# Patient Record
Sex: Male | Born: 1995 | Race: Black or African American | Hispanic: No | Marital: Single | Smoking: Never smoker
Health system: Southern US, Community
[De-identification: ages and names within clinical notes are randomized; demographics above are authoritative.]

## PROBLEM LIST (undated history)

## (undated) DIAGNOSIS — R569 Unspecified convulsions: Secondary | ICD-10-CM

## (undated) HISTORY — DX: Unspecified convulsions: R56.9

---

## 2017-06-13 ENCOUNTER — Other Ambulatory Visit: Payer: Self-pay

## 2017-06-13 ENCOUNTER — Encounter: Payer: Self-pay | Admitting: Family Medicine

## 2017-06-13 ENCOUNTER — Ambulatory Visit (INDEPENDENT_AMBULATORY_CARE_PROVIDER_SITE_OTHER): Payer: BC Managed Care – PPO

## 2017-06-13 ENCOUNTER — Ambulatory Visit: Payer: BC Managed Care – PPO | Admitting: Family Medicine

## 2017-06-13 VITALS — BP 106/60 | HR 49 | Temp 98.4°F | Resp 18 | Ht 76.0 in | Wt 202.8 lb

## 2017-06-13 DIAGNOSIS — S4992XA Unspecified injury of left shoulder and upper arm, initial encounter: Secondary | ICD-10-CM

## 2017-06-13 DIAGNOSIS — S6991XA Unspecified injury of right wrist, hand and finger(s), initial encounter: Secondary | ICD-10-CM

## 2017-06-13 DIAGNOSIS — S62111A Displaced fracture of triquetrum [cuneiform] bone, right wrist, initial encounter for closed fracture: Secondary | ICD-10-CM | POA: Diagnosis not present

## 2017-06-13 DIAGNOSIS — S43422A Sprain of left rotator cuff capsule, initial encounter: Secondary | ICD-10-CM

## 2017-06-13 MED ORDER — IBUPROFEN 800 MG PO TABS: 800.0000 mg | ORAL_TABLET | Freq: Three times a day (TID) | ORAL | 0 refills | Status: AC | PRN

## 2017-06-13 NOTE — Patient Instructions (Addendum)
IF you received an x-ray today, you will receive an invoice from [facility]. Please contact [facility] at [phone] with questions or concerns regarding your invoice.   IF you received labwork today, you will receive an invoice from [place]. Please contact [facility] at [phone] with questions or concerns regarding your invoice.   Our billing staff will not be able to assist you with questions regarding bills from these companies.  You will be contacted with the lab results as soon as they are available. The fastest way to get your results is to activate your My Chart account. Instructions are located on the last page of this paperwork. If you have not heard from [place] regarding the results in 2 weeks, please contact this office.     Wrist Splint A splint is a medical device that keeps an injured part of your body from moving. A splint supports your wrist like a cast, but it is more flexible. It can be removed or loosened. The supporting part of a splint does not completely surround your wrist. It is held in place with an elastic band or straps. You may need a wrist splint if you have hurt your wrist or if you have a condition that causes swelling. Depending on the type of wrist problem you have, your splint may extend up your arm, onto your hand, or around your thumb. The wrist splint may be worn to:  Support your wrist.  Protect your injury.  Prevent further injury.  Prevent movement.  Reduce pain.  Promote healing. RISKS AND COMPLICATIONS The most dangerous complication of wearing a splint is having a reduced blood supply to your wrist or hand. This can happen if there is a lot of swelling or if the splint is too tight. This results in a condition called compartment syndrome and can cause permanent damage. Symptoms include:  Pain that is getting worse.  Tingling and numbness.  Changes in skin color (paleness or a bluish color).  Cold fingers. Other  complications of wearing a splint can include:   Skin irritation that can cause: ? Itching. ? Rash. ? Skin sores. ? Skin infection.  Wrist stiffness. This can occur if you have worn a splint for a long time. HOW TO USE YOUR WRIST SPLINT Your wrist splint should be tight enough to support your wrist without cutting off your blood supply. How long you need to wear the splint depends on the type of wrist problem you have. Your health care provider will instruct you on how to wear your wrist splint and for how long.  Follow all your health care provider's instructions.  Take medicine only as directed by your health care provider.  Keep your wrist elevated above the level of your heart while resting.  Ice may help reduce pain and swelling. ? Place ice in a plastic bag. ? Place a towel between your splint and the bag. ? Leave the ice on for 20 minutes, 2-3 times a day.  Do not get your splint wet.  Do not push objects under your splint to scratch your skin.  Loosen your splint if it feels too tight. Consult your health care provider if you have questions about how tight to wear the splint.  Keep all follow-up visits as directed by your health care provider. This is important. SEEK MEDICAL CARE IF:  You have wrist pain or swelling that does not go away.  The skin around or under your splint becomes red, itchy, or  moist.  You have chills or fever.  Your splint feels too tight or too loose.  Your splint gets damaged. SEEK IMMEDIATE MEDICAL CARE IF: You have symptoms of compartment syndrome. These include:   Pain that is getting worse.  Tingling and numbness.  Changes in skin color (paleness or a bluish color).  Cold fingers. MAKE SURE YOU:  Understand these instructions.  Will watch your condition.  Will get help right away if you are not doing well or get worse. This information is not intended to replace advice given to you by your health care provider. Make sure  you discuss any questions you have with your health care provider. Document Released: 02/22/2006 Document Revised: 04/02/2014 Document Reviewed: 06/23/2013 Elsevier Interactive Patient Education  2017 [employer] Inc.  Wrist Fracture Treated With Immobilization A wrist fracture is a break or crack in one of the bones of your wrist. Your wrist is made up of eight small bones at the palm of your hand (carpal bones) and two long bones that make up your forearm (radius and ulna). If the joint is stable and the bones are still in their normal position (nondisplaced), the injury may be treated with immobilization. This involves the use of a cast, splint, or sling to hold your arm in place. Immobilization ensures that your bones continue to stay in the correct position while your arm is healing. What are the causes? This condition may be caused by:  A direct force to the wrist.  Falling on an outstretched hand.  Trauma, such as a car accident or a fall.  What increases the risk? This condition is more likely to develop in people who:  Do contact and high-risk sports, such as skiing, biking, and ice skating.  Take steroid medicines.  Smoke.  Are male.  Are Caucasian.  Drink more than three alcoholic beverages per day.  Have low or lowered bone density (osteoporosis or osteopenia).  Are older.  Have a history of previous fractures.  What are the signs or symptoms? Symptoms of this condition include:  Pain.  Swelling.  Bruising.  Not being able to move the wrist normally.  Additionally, the wrist may hang in an odd position or appear deformed. How is this diagnosed? This condition may be diagnosed based on a physical exam and X-rays. You may also have a CT scan or MRI. How is this treated? Treatment for this condition involves wearing a cast or splint until the injured area is stable enough for you to begin range-of-motion exercises. You also may be given a sling. You may  also be prescribed pain medicine. Follow these instructions at home: If you have a splint:  Wear the splint as told by your health care provider. Remove it only as told by your health care provider.  Loosen the splint if your fingers tingle, become numb, or turn cold and blue.  Do not let your splint get wet if it is not waterproof.  Keep the splint clean. If you have a sling:  Wear it as told by your health care provider. Remove it only as told by your health care provider. If you have a cast:  Do not stick anything inside the cast to scratch your skin. Doing that increases your risk of infection.  Check the skin around the cast every day. Report any concerns to your health care provider. You may put lotion on dry skin around the edges of the cast. Do not apply lotion to the skin underneath the cast.  Do not let your cast get wet if it is not waterproof.  Keep the cast clean. Bathing  Do not take baths, swim, or use a hot tub until your health care provider approves. Ask your health care provider if you can take showers. You may only be allowed to take sponge baths for bathing.  If your cast or splint is not waterproof, cover it with a watertight plastic bag when you take a bath or a shower.  If you have a sling, remove it for bathing only if your health care provider tells you that it is safe to do that. Managing pain, stiffness, and swelling  If directed, apply ice to the injured area. ? Put ice in a plastic bag. ? Place a towel between your skin and the bag. ? Leave the ice on for 20 minutes, 2-3 times per day.  Move your fingers often to avoid stiffness and to lessen swelling.  Raise (elevate) the injured area above the level of your heart while you are sitting or lying down. Driving  Do not drive or operate heavy machinery while taking prescription pain medicine.  Ask your health care provider when it is safe to drive if you have a cast, splint, or sling on your  wrist. Activity  Return to your normal activities as told by your health care provider. Ask your health care provider what activities are safe for you.  Do range-of-motion exercises only as told by your health care provider or physical therapist. General instructions  Do not put pressure on any part of the cast or splint until it is fully hardened. This may take several hours.  Do not use any tobacco products, such as cigarettes, chewing tobacco, and e-cigarettes. Tobacco can delay bone healing. If you need help quitting, ask your health care provider.  Take over-the-counter and prescription medicines only as told by your health care provider.  Keep all follow-up visits as told by your health care provider. This is important. Contact a health care provider if:  Your cast, splint, or sling is damaged or loose.  You have any new pain, swelling, or bruising.  Your pain, swelling, and bruising do not improve.  You have a fever.  You have chills. Get help right away if:  Your skin or fingers on your injured arm turn blue or gray.  Your arm feels cold or gets numb.  You have severe pain in your injured wrist. This information is not intended to replace advice given to you by your health care provider. Make sure you discuss any questions you have with your health care provider. Document Released: 12/20/2004 Document Revised: 08/24/2015 Document Reviewed: 11/24/2014 Elsevier Interactive Patient Education  2018 Elsevier Inc.  Rotator Cuff Injury Rotator cuff injury is any type of injury to the set of muscles and tendons that make up the stabilizing unit of your shoulder. This unit holds the ball of your upper arm bone (humerus) in the socket of your shoulder blade (scapula). What are the causes? Injuries to your rotator cuff most commonly come from sports or activities that cause your arm to be moved repeatedly over your head. Examples of this include throwing, weight lifting,  swimming, or racquet sports. Long lasting (chronic) irritation of your rotator cuff can cause soreness and swelling (inflammation), bursitis, and eventual damage to your tendons, such as a tear (rupture). What are the signs or symptoms? Acute rotator cuff tear:  Sudden tearing sensation followed by severe pain shooting from your upper shoulder down  your arm toward your elbow.  Decreased range of motion of your shoulder because of pain and muscle spasm.  Severe pain.  Inability to raise your arm out to the side because of pain and loss of muscle power (large tears).  Chronic rotator cuff tear:  Pain that usually is worse at night and may interfere with sleep.  Gradual weakness and decreased shoulder motion as the pain worsens.  Decreased range of motion.  Rotator cuff tendinitis:  Deep ache in your shoulder and the outside upper arm over your shoulder.  Pain that comes on gradually and becomes worse when lifting your arm to the side or turning it inward.  How is this diagnosed? Rotator cuff injury is diagnosed through a medical history, physical exam, and imaging exam. The medical history helps determine the type of rotator cuff injury. Your health care provider will look at your injured shoulder, feel the injured area, and ask you to move your shoulder in different positions. X-ray exams typically are done to rule out other causes of shoulder pain, such as fractures. MRI is the exam of choice for the most severe shoulder injuries because the images show muscles and tendons. How is this treated? Chronic tear:  Medicine for pain, such as acetaminophen or ibuprofen.  Physical therapy and range-of-motion exercises may be helpful in maintaining shoulder function and strength.  Steroid injections into your shoulder joint.  Surgical repair of the rotator cuff if the injury does not heal with noninvasive treatment.  Acute tear:  Anti-inflammatory medicines such as ibuprofen and  naproxen to help reduce pain and swelling.  A sling to help support your arm and rest your rotator cuff muscles. Long-term use of a sling is not advised. It may cause significant stiffening of the shoulder joint.  Surgery may be considered within a few weeks, especially in younger, active people, to return the shoulder to full function.  Indications for surgical treatment include the following: ? Age younger than 60 years. ? Rotator cuff tears that are complete. ? Physical therapy, rest, and anti-inflammatory medicines have been used for 6-8 weeks, with no improvement. ? Employment or sporting activity that requires constant shoulder use.  Tendinitis:  Anti-inflammatory medicines such as ibuprofen and naproxen to help reduce pain and swelling.  A sling to help support your arm and rest your rotator cuff muscles. Long-term use of a sling is not advised. It may cause significant stiffening of the shoulder joint.  Severe tendinitis may require: ? Steroid injections into your shoulder joint. ? Physical therapy. ? Surgery.  Follow these instructions at home:  Apply ice to your injury: ? Put ice in a plastic bag. ? Place a towel between your skin and the bag. ? Leave the ice on for 20 minutes, 2-3 times a day.  If you have a shoulder immobilizer (sling and straps), wear it until told otherwise by your health care provider.  You may want to sleep on several pillows or in a recliner at night to lessen swelling and pain.  Only take over-the-counter or prescription medicines for pain, discomfort, or fever as directed by your health care provider.  Do simple hand squeezing exercises with a soft rubber ball to decrease hand swelling. Contact a health care provider if:  Your shoulder pain increases, or new pain or numbness develops in your arm, hand, or fingers.  Your hand or fingers are colder than your other hand. Get help right away if:  Your arm, hand, or fingers are numb or  tingling.  Your arm, hand, or fingers are increasingly swollen and painful, or they turn white or blue. This information is not intended to replace advice given to you by your health care provider. Make sure you discuss any questions you have with your health care provider. Document Released: 03/09/2000 Document Revised: 08/18/2015 Document Reviewed: 10/22/2012 Elsevier Interactive Patient Education  2018 [employer]

## 2017-06-13 NOTE — Progress Notes (Signed)
Subjective:  By signing my name below, I, Casey Sanchez, attest that this documentation has been prepared under the direction and in the presence of Norberto Sorenson, MD Electronically Signed: Charline Bills, ED Scribe 06/13/2017 at 9:47 AM.   Patient ID: Casey Sanchez, male    DOB: 1996/02/27, 22 y.o.   MRN: [id number]  Chief Complaint  Patient presents with  . Wrist Injury    right wrist, pt states injury happened last night while playing basketball. Pt states wrist hurts with movement and states he has some swellling.  . Shoulder Injury    left shoulder, pt states it feels like shoulder is out of place.   HPI Casey Sanchez is a 22 y.o. male who presents to Primary Care at [facility] complaining of R wrist and L shoulder injury that occurred last night while playing basketball. Pt states that he was attempting to dunk last night and grab the rim but was going too fast. He landed on his L side and tried to brace himself with his R hand, injuring his L shoulder and R wrist. Pt reports R wrist swelling and pain that is exacerbated with movement. Also reports L shoulder pain that is exacerbated with lifting. He reports similar injury to the L shoulder in the past that he treated with rest and ice. Pt is R hand dominant.  Past Medical History:  Diagnosis Date  . Seizures (HCC)    Current Outpatient Medications on File Prior to Visit  Medication Sig Dispense Refill  . levETIRAcetam (KEPPRA) 500 MG tablet Take 500 mg by mouth 2 (two) times daily.     No current facility-administered medications on file prior to visit.    History reviewed. No pertinent surgical history.  No Known Allergies Family History  Problem Relation Age of Onset  . Cancer Mother   . Hyperlipidemia Father    Social History   Socioeconomic History  . Marital status: Single    Spouse name: Not on file  . Number of children: Not on file  . Years of education: Not on file  . Highest education level: Not on file  Occupational  History  . Not on file  Social Needs  . Financial resource strain: Not on file  . Food insecurity:    Worry: Not on file    Inability: Not on file  . Transportation needs:    Medical: Not on file    Non-medical: Not on file  Tobacco Use  . Smoking status: Never Smoker  . Smokeless tobacco: Never Used  Substance and Sexual Activity  . Alcohol use: Yes  . Drug use: Never  . Sexual activity: Not on file  Lifestyle  . Physical activity:    Days per week: Not on file    Minutes per session: Not on file  . Stress: Not on file  Relationships  . Social connections:    Talks on phone: Not on file    Gets together: Not on file    Attends religious service: Not on file    Active member of club or organization: Not on file    Attends meetings of clubs or organizations: Not on file    Relationship status: Not on file  Other Topics Concern  . Not on file  Social History Narrative  . Not on file   Depression screen [facility] 2/9 06/13/2017  Decreased Interest 0  Down, Depressed, Hopeless 0  PHQ - 2 Score 0     Review of Systems  Musculoskeletal: Positive  for arthralgias and joint swelling.      Objective:   Physical Exam  Constitutional: He is oriented to person, place, and time. He appears well-developed and well-nourished. No distress.  HENT:  Head: Normocephalic and atraumatic.  Eyes: Conjunctivae and EOM are normal.  Neck: Neck supple. No tracheal deviation present.  Cardiovascular: Normal rate.  Pulmonary/Chest: Effort normal. No respiratory distress.  Musculoskeletal: Normal range of motion.  R wrist: Tenderness over proximal 5th metatarsal. Edema and focal tenderness over dorsal aspect over lateral and mid metacarpal. Mild limitation in flexion, severe limitation in extension.  L Shoulder: pain with abduction greater than 90 degrees, flexion greater than 90 degrees. Mild limitation in extension. Mild limitation in adduction. Point tenderness over tip of acromion and  posterior glenohumeral joint aspect.  Neurological: He is alert and oriented to person, place, and time.  Skin: Skin is warm and dry.  Psychiatric: He has a normal mood and affect. His behavior is normal.  Nursing note and vitals reviewed.  Dg Wrist Complete Right  Result Date: 06/13/2017 CLINICAL DATA:  Fall playing basketball. EXAM: RIGHT WRIST - COMPLETE 3+ VIEW COMPARISON:  No recent prior. FINDINGS: Tiny bony density is noted along the dorsum of the wrist. This may represent a tiny fracture fragment such as a triquetral fracture fragment. No other focal abnormalities identified. Distal radius is intact. No soft tissue foreign body. IMPRESSION: Tiny bony densities noted along the dorsum of the wrist. This may represent a tiny fracture fragment such as a triquetral fracture fragment. No other abnormalities identified. Electronically Signed   By: Maisie Fus  Register   On: 06/13/2017 10:27   Dg Shoulder Left  Result Date: 06/13/2017 CLINICAL DATA:  Status post fall, shoulder pain. Tenderness over the acromion. EXAM: LEFT SHOULDER - 2+ VIEW COMPARISON:  None. FINDINGS: There is no evidence of fracture or dislocation. There is no evidence of arthropathy or other focal bone abnormality. Soft tissues are unremarkable. IMPRESSION: No acute osseous injury of the left shoulder. Electronically Signed   By: Elige Ko   On: 06/13/2017 10:28   BP 106/60 (BP Location: Right Arm, Patient Position: Sitting, Cuff Size: Normal)   Pulse (!) 49   Temp 98.4 F (36.9 C) (Oral)   Resp 18   Ht 6\' 4"  (1.93 m)   Wt 202 lb 12.8 oz (92 kg)   SpO2 98%   BMI 24.69 kg/m     Assessment & Plan:   1. Closed chip fracture of triquetrum of right wrist, initial encounter   2. Wrist injury, right, initial encounter   3. Shoulder injury, left, initial encounter   4. Sprain of left rotator cuff capsule, initial encounter     Orders Placed This Encounter  Procedures  . DG Wrist Complete Right    Standing Status:    Future    Number of Occurrences:   1    Standing Expiration Date:   06/13/2018    Order Specific Question:   Reason for Exam (SYMPTOM  OR DIAGNOSIS REQUIRED)    Answer:   s/p fall onto shoulder and FOOSH yest playing basketball, point tenderness over acromion and dorsal mid & lateral wrist    Order Specific Question:   Preferred imaging location?    Answer:   External  . DG Shoulder Left    Standing Status:   Future    Number of Occurrences:   1    Standing Expiration Date:   06/13/2018    Order Specific Question:   Reason  for Exam (SYMPTOM  OR DIAGNOSIS REQUIRED)    Answer:   s/p fall onto shoulder and FOOSH yest playing basketball, point tenderness over acromion and dorsal mid & lateral wrist    Order Specific Question:   Preferred imaging location?    Answer:   External  . Ambulatory referral to Orthopedic Surgery    Referral Priority:   Medium    Referral Type:   Surgical    Referral Reason:   Specialty Services Required    Requested Specialty:   Orthopedic Surgery    Number of Visits Requested:   1    Meds ordered this encounter  Medications  . ibuprofen (ADVIL,MOTRIN) 800 MG tablet    Sig: Take 1 tablet (800 mg total) by mouth every 8 (eight) hours as needed.    Dispense:  30 tablet    Refill:  0    I personally performed the services described in this documentation, which was scribed in my presence. The recorded information has been reviewed and considered, and addended by me as needed.   Norberto SorensonEva Tamica Covell, M.D.  [facility]  [place] [address] [place], [place] [address] [phone] phone [phone] fax  12/19/17 3:14 AM

## 2017-06-18 ENCOUNTER — Encounter (INDEPENDENT_AMBULATORY_CARE_PROVIDER_SITE_OTHER): Payer: Self-pay | Admitting: Orthopaedic Surgery

## 2017-06-18 ENCOUNTER — Ambulatory Visit (INDEPENDENT_AMBULATORY_CARE_PROVIDER_SITE_OTHER): Payer: BC Managed Care – PPO | Admitting: Orthopaedic Surgery

## 2017-06-18 VITALS — BP 119/67 | HR 65 | Resp 16 | Ht 76.0 in | Wt 202.0 lb

## 2017-06-18 DIAGNOSIS — M79642 Pain in left hand: Secondary | ICD-10-CM | POA: Diagnosis not present

## 2017-06-18 NOTE — Progress Notes (Signed)
Office Visit Note   Patient: Casey Sanchez           Date of Birth: 02/05/1996           MRN: [id number] Visit Date: 06/18/2017              Requested by: Sherren Mocha, MD [address] [place], [place] [address] PCP: Patient, No Pcp Per   Assessment & Plan: Visit Diagnoses:  1. Pain of left hand     Plan: 6 days status post injury to left hand playing basketball.  Films at [facility] urgent care demonstrated a small avulsion fracture from the carpus.  Placed in a splint and asked to follow-up here.  I reviewed the x-rays and agree with the small avulsion chip.  He is minimally symptomatic.  Splint was removed.  He will try a volar wrist splint that he has at home for several weeks and avoid activities causing pain.  Should resolve by then.  If not he will return  Follow-Up Instructions: Return if symptoms worsen or fail to improve.   Orders:  No orders of the defined types were placed in this encounter.  No orders of the defined types were placed in this encounter.     Procedures: No procedures performed   Clinical Data: No additional findings.   Subjective: Chief Complaint  Patient presents with  . Right Wrist - Pain, Injury  . New Patient (Initial Visit)    r wrist fx from 06-13-17 no numbness, tinging or weakness just swelling.  Playing basketball 6 days ago when he "do not do the ball".  He got caught up on the rim and came down trying to break his fall with both of his arms behind his back.  He had onset of pain in the dorsum of his left nondominant hand.  [facility] with [facility]-rays demonstrate small avulsion "chip".  He was placed in a splint and Ace bandage and asked to follow-up in the office.  Feeling much better.  No numbness or tingling. The films on the PACS system and agree with a very small avulsion chip of occult to tell the origin but it is on the dorsum of the hand  HPI  Review of Systems  Constitutional: Negative for fatigue and  fever.  HENT: Negative for ear pain.   Eyes: Negative for pain.  Respiratory: Negative for cough and shortness of breath.   Cardiovascular: Negative for leg swelling.  Gastrointestinal: Negative for blood in stool, constipation and diarrhea.  Genitourinary: Negative for difficulty urinating.  Musculoskeletal: Negative for back pain and neck pain.  Skin: Negative for rash and wound.  Allergic/Immunologic: Negative for food allergies.  Neurological: Negative for dizziness, weakness, light-headedness, numbness and headaches.  Hematological: Does not bruise/bleed easily.  Psychiatric/Behavioral: Negative for sleep disturbance.     Objective: Vital Signs: BP 119/67 (BP Location: Left Arm, Patient Position: Sitting, Cuff Size: Normal)   Pulse 65   Resp 16   Ht 6\' 4"  (1.93 m)   Wt 202 lb (91.6 kg)   BMI 24.59 kg/m   Physical Exam  Ortho Exam splint removed from left hand.  Minimal tenderness in the dorsum of the hand with minimal swelling.  No ecchymosis or erythema.  Can make a full fist and released.  Neurovascular exam intact.  No wrist pain.  At the ulna or radius  Specialty Comments:  No specialty comments available.  Imaging: No results found.   PMFS History:  There are no active problems to display for this patient.  Past Medical History:  Diagnosis Date  . Seizures (HCC)     Family History  Problem Relation Age of Onset  . Cancer Mother   . Hyperlipidemia Father     History reviewed. No pertinent surgical history. Social History   Occupational History  . Not on file  Tobacco Use  . Smoking status: Never Smoker  . Smokeless tobacco: Never Used  Substance and Sexual Activity  . Alcohol use: Yes  . Drug use: Never  . Sexual activity: Not on file

## 2017-09-01 ENCOUNTER — Encounter (HOSPITAL_COMMUNITY): Payer: Self-pay | Admitting: Emergency Medicine

## 2017-09-01 ENCOUNTER — Emergency Department (HOSPITAL_COMMUNITY): Payer: BC Managed Care – PPO

## 2017-09-01 ENCOUNTER — Emergency Department (HOSPITAL_COMMUNITY)
Admission: EM | Admit: 2017-09-01 | Discharge: 2017-09-01 | Disposition: A | Discharge status: Home / Self Care | Payer: BC Managed Care – PPO | Attending: Emergency Medicine | Admitting: Emergency Medicine

## 2017-09-01 DIAGNOSIS — Z79899 Other long term (current) drug therapy: Secondary | ICD-10-CM | POA: Diagnosis not present

## 2017-09-01 DIAGNOSIS — G40909 Epilepsy, unspecified, not intractable, without status epilepticus: Secondary | ICD-10-CM | POA: Insufficient documentation

## 2017-09-01 DIAGNOSIS — R569 Unspecified convulsions: Secondary | ICD-10-CM

## 2017-09-01 LAB — BASIC METABOLIC PANEL
Anion gap: 8 (ref 5–15)
BUN: 11 mg/dL (ref 6–20)
CALCIUM: 9.4 mg/dL (ref 8.9–10.3)
CHLORIDE: 104 mmol/L (ref 101–111)
CO2: 28 mmol/L (ref 22–32)
CREATININE: 1.16 mg/dL (ref 0.61–1.24)
GFR calc Af Amer: >60 mL/min (ref >60)
GFR calc non Af Amer: >60 mL/min (ref >60)
Glucose, Bld: 114 mg/dL — ABNORMAL HIGH (ref 65–99)
Potassium: 3.7 mmol/L (ref 3.5–5.1)
Sodium: 140 mmol/L (ref 135–145)

## 2017-09-01 LAB — ETHANOL: Alcohol, Ethyl (B): <10 mg/dL (ref <10)

## 2017-09-01 LAB — CBG MONITORING, ED: Glucose-Capillary: 84 mg/dL (ref 65–99)

## 2017-09-01 MED ORDER — LEVETIRACETAM IN NACL 1000 MG/100ML IV SOLN
1000.0000 mg | Freq: Once | INTRAVENOUS | Status: AC
  Administered 2017-09-01: 1000 mg via INTRAVENOUS
  Filled 2017-09-01: qty 100

## 2017-09-01 NOTE — Discharge Instructions (Addendum)
You were seen today for seizure-like activity.  You need to make sure to take your Keppra as prescribed.  Follow-up with your primary physician or neurologist for adjustments of medication.

## 2017-09-01 NOTE — ED Triage Notes (Signed)
Pt arrives via EMS after a seizure witnessed by his date, reports full body involvement, he did fall off the bed. Abrasion to R face. Pt confused at this time. Did bite hi tongue. Hx of seizures- supposed to take keppra. ETOH on board, pt not answering other questions.

## 2017-09-01 NOTE — ED Notes (Signed)
Ambulated patient in hall with no difficulty this time. Alert and oriented. Provided discharge instructions and what I know of his whereabouts prior to seizure.

## 2017-09-01 NOTE — ED Provider Notes (Signed)
[facility] EMERGENCY DEPARTMENT Provider Note   CSN: [id number] Arrival date & time: 09/01/17  0035     History   Chief Complaint Chief Complaint  Patient presents with  . Seizures    HPI Casey Sanchez is a 22 y.o. male.  HPI  This is a 22 year old male with a history of seizures who presents after a seizure.  Per EMS, seizure was witnessed by his date.  He reportedly fell off the bed.  Patient was confused upon EMS arrival.  He has a history of seizures and takes Keppra.  He does report missing his Keppra dose yesterday.  He also reports alcohol use.  Patient does not provide much other history.  He states he is unsure who he was with.  He is currently awake and oriented x3.  He denies any pain anywhere including headache or neck pain.  Past Medical History:  Diagnosis Date  . Seizures (HCC)     There are no active problems to display for this patient.   History reviewed. No pertinent surgical history.      Home Medications    Prior to Admission medications   Medication Sig Start Date End Date Taking? Authorizing Provider  ibuprofen (ADVIL,MOTRIN) 800 MG tablet Take 1 tablet (800 mg total) by mouth every 8 (eight) hours as needed. 06/13/17   Sherren Mocha, MD  levETIRAcetam (KEPPRA) 500 MG tablet Take 500 mg by mouth 2 (two) times daily.    [provider]    Family History Family History  Problem Relation Age of Onset  . Cancer Mother   . Hyperlipidemia Father     Social History Social History   Tobacco Use  . Smoking status: Never Smoker  . Smokeless tobacco: Never Used  Substance Use Topics  . Alcohol use: Yes  . Drug use: Never     Allergies   Patient has no known allergies.   Review of Systems Review of Systems  Constitutional: Negative for fever.  Respiratory: Negative for shortness of breath.   Cardiovascular: Negative for chest pain.  Gastrointestinal: Negative for abdominal pain, nausea and vomiting.    Musculoskeletal: Negative for neck pain.  Skin: Positive for wound.  Neurological: Positive for seizures. Negative for headaches.  All other systems reviewed and are negative.    Physical Exam Updated Vital Signs BP (!) 104/51   Pulse 82   Temp 98.6 F (37 C) (Oral)   Resp (!) 22   SpO2 99%   Physical Exam  Constitutional: He is oriented to person, place, and time. He appears well-developed and well-nourished. No distress.  HENT:  Head: Normocephalic.  Abrasion over the forehead and right cheek  Eyes: Pupils are equal, round, and reactive to light.  Neck: Neck supple.  Normal range of motion, no midline C-spine tenderness to palpation, step-off, deformity  Cardiovascular: Normal rate, regular rhythm and normal heart sounds.  No murmur heard. Pulmonary/Chest: Effort normal and breath sounds normal. No respiratory distress. He has no wheezes.  Abdominal: Soft. Bowel sounds are normal. There is no tenderness. There is no rebound.  Musculoskeletal: He exhibits no edema.  Neurological: He is alert and oriented to person, place, and time.  Alert and oriented x3, cranial nerves II through XII intact, 5 out of 5 strength in all 4 extremities, no dysmetria to finger-nose-finger  Skin: Skin is warm and dry.  Psychiatric: He has a normal mood and affect.  Nursing note and vitals reviewed.    ED Treatments /  Results  Labs (all labs ordered are listed, but only abnormal results are displayed) Labs Reviewed  BASIC METABOLIC PANEL - Abnormal; Notable for the following components:      Result Value   Glucose, Bld 114 (*)    All other components within normal limits  ETHANOL  CBG MONITORING, ED    EKG None  Radiology Ct Head Wo Contrast  Result Date: 09/01/2017 CLINICAL DATA:  Status post seizure and fall off bed. Initial encounter. EXAM: CT HEAD WITHOUT CONTRAST TECHNIQUE: Contiguous axial images were obtained from the base of the skull through the vertex without intravenous  contrast. COMPARISON:  None. FINDINGS: Brain: No evidence of acute infarction, hemorrhage, hydrocephalus, extra-axial collection or mass lesion/mass effect. The posterior fossa, including the cerebellum, brainstem and fourth ventricle, is within normal limits. The third and lateral ventricles, and basal ganglia are unremarkable in appearance. The cerebral hemispheres are symmetric in appearance, with normal gray-white differentiation. No mass effect or midline shift is seen. Vascular: No hyperdense vessel or unexpected calcification. Skull: There is no evidence of fracture; visualized osseous structures are unremarkable in appearance. Sinuses/Orbits: The visualized portions of the orbits are within normal limits. The paranasal sinuses and mastoid air cells are well-aerated. Other: No significant soft tissue abnormalities are seen. IMPRESSION: No evidence of traumatic intracranial injury or fracture. Electronically Signed   By: Roanna RaiderJeffery  Chang M.D.   On: 09/01/2017 02:32    Procedures Procedures (including critical care time)  Medications Ordered in ED Medications  levETIRAcetam (KEPPRA) IVPB 1000 mg/100 mL premix (1,000 mg Intravenous New Bag/Given 09/01/17 0320)     Initial Impression / Assessment and Plan / ED Course  I have reviewed the triage vital signs and the nursing notes.  Pertinent labs & imaging results that were available during my care of the patient were reviewed by me and considered in my medical decision making (see chart for details).     Patient presents with seizures.  History of the same.  No collateral information available and mostly taken from EMS.  Patient does not have any recollection of the event.  He is currently awake, alert, and oriented.  He does have an abrasion over his forehead and right cheek but otherwise is nontoxic and neurologic exam is reassuring.  CBG is 84.  Metabolites are within normal limits.  No significant alcohol intoxication.  CT scan is negative.   Patient was loaded with IV Keppra.  He does report missing a dose yesterday.  Question compliance.  At this time I do not feel he needs adjustment in dosing but I have urged him to take his medication as prescribed.  Follow-up with primary physician or neurologist.  After history, exam, and medical workup I feel the patient has been appropriately medically screened and is safe for discharge home. Pertinent diagnoses were discussed with the patient. Patient was given return precautions.   Final Clinical Impressions(s) / ED Diagnoses   Final diagnoses:  Seizure [facility](HCC)    ED Discharge Orders    None       Wilkie AyeHorton, Mayer Maskerourtney F, MD 09/01/17 [id number]

## 2017-09-01 NOTE — ED Notes (Signed)
Attempted to ambulate patient x2, too unsteady to ambulate.

## 2019-02-10 IMAGING — CT CT HEAD W/O CM
4 series · 15 of 47 positions shown, 17 images · non-contrast
Comparison: None.

CLINICAL DATA: Status post seizure and fall off bed. Initial
encounter.

EXAM:
CT HEAD WITHOUT CONTRAST
TECHNIQUE: Contiguous axial images were obtained from the base of the skull
through the vertex without intravenous contrast.

[Series 3: head without · axial · non-contrast · 0.48mm/px · z∈[-40,+80]mm · 7 of 33 slices shown, 9 images]
[im 5/33  brain]
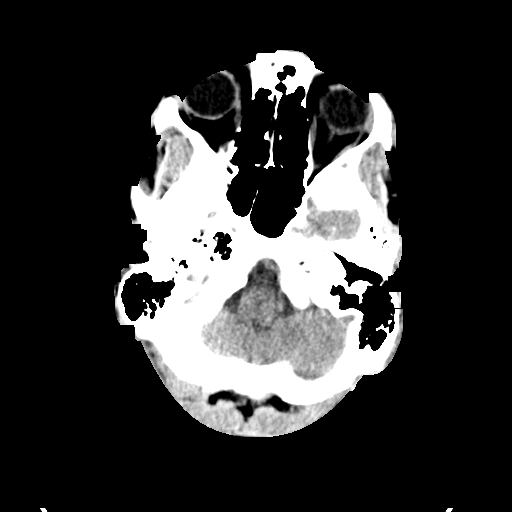
[im 5/33  bone]
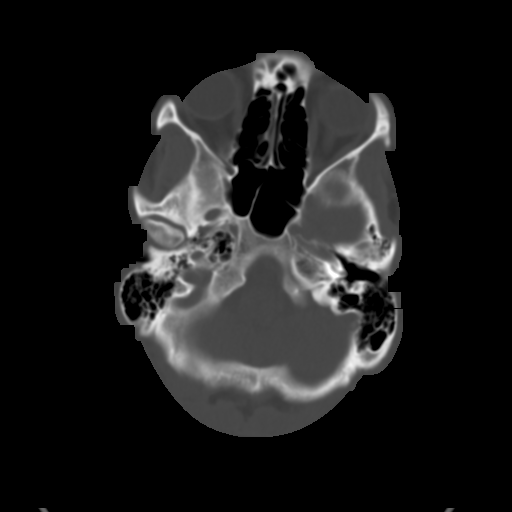
[im 9/33  brain]
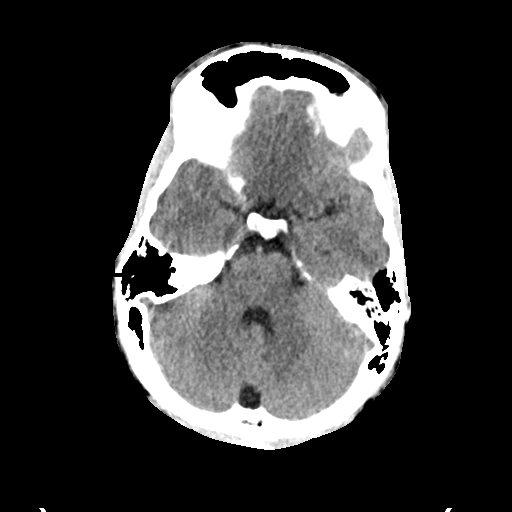
[im 13/33  brain]
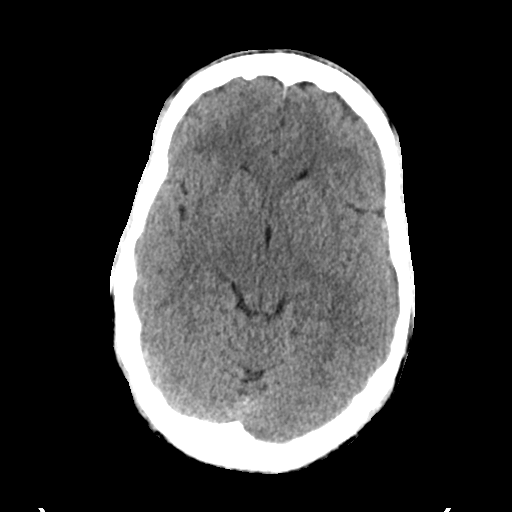
[im 17/33  brain]
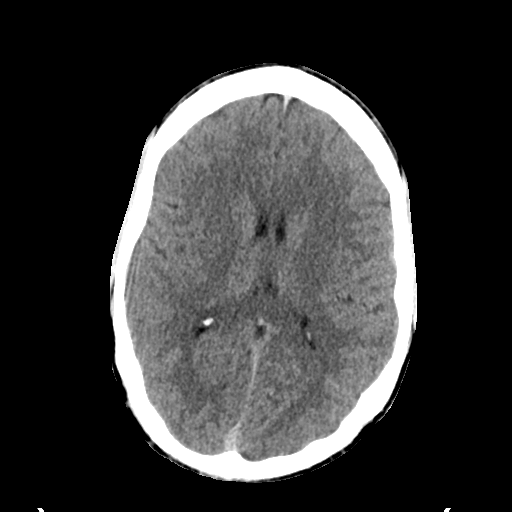
[im 21/33  brain]
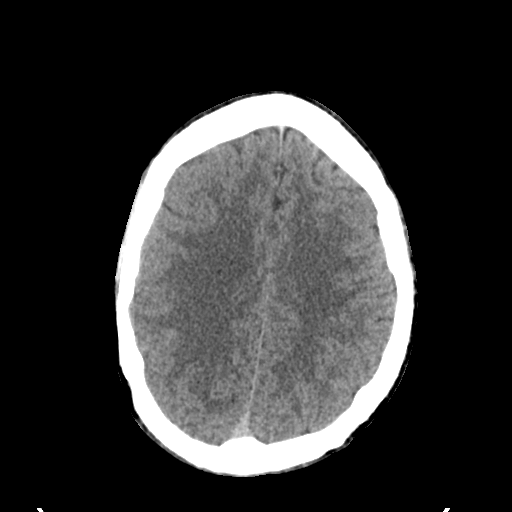
[im 21/33  bone]
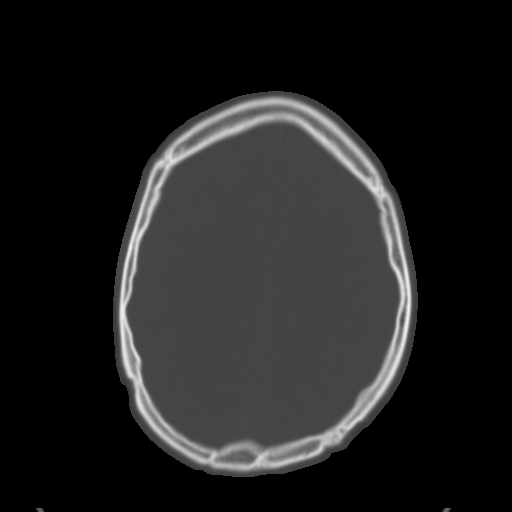
[im 25/33  brain]
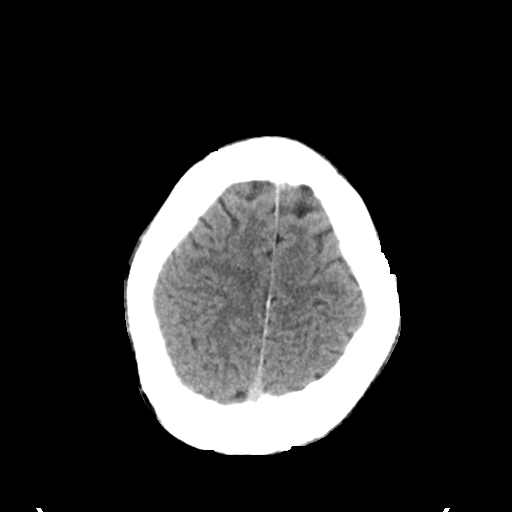
[im 29/33  brain]
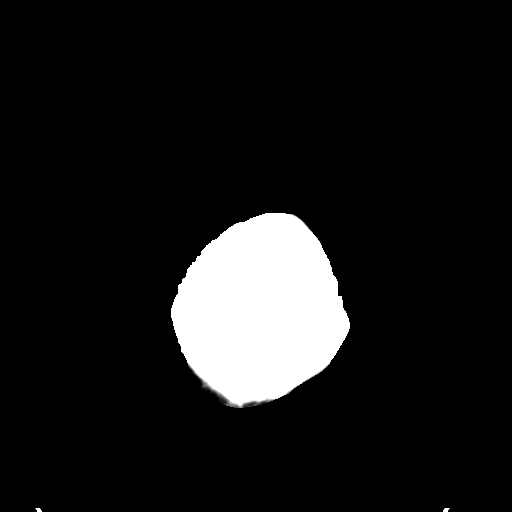

[Series 4: head bone · axial · 0.48mm/px · z∈[-44,-28]mm · 2 of 82 slices shown]
[im 9/82  bone]
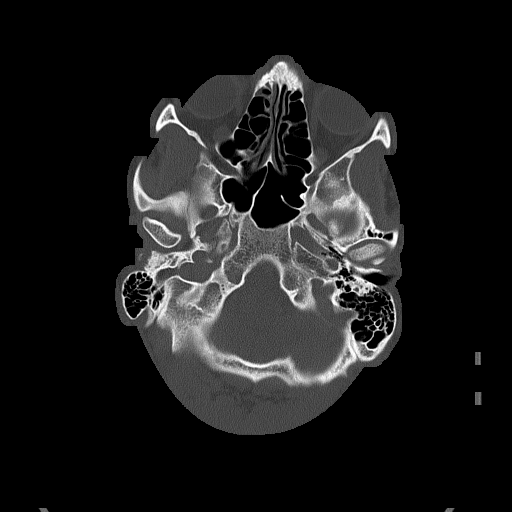
[im 17/82  bone]
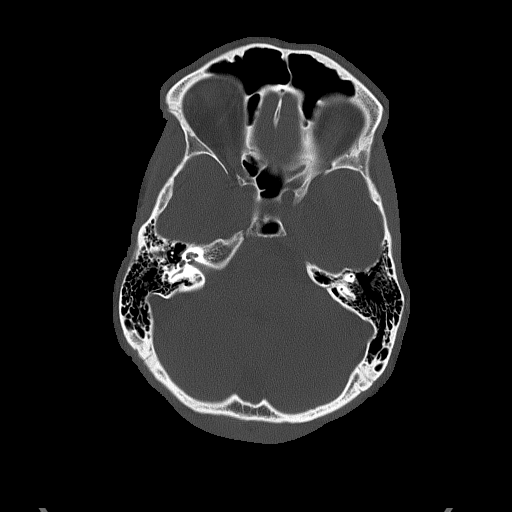

[Series 5: head without cor · coronal · non-contrast · 0.33mm/px · 3 of 69 slices shown]
[im 23/69  brain]
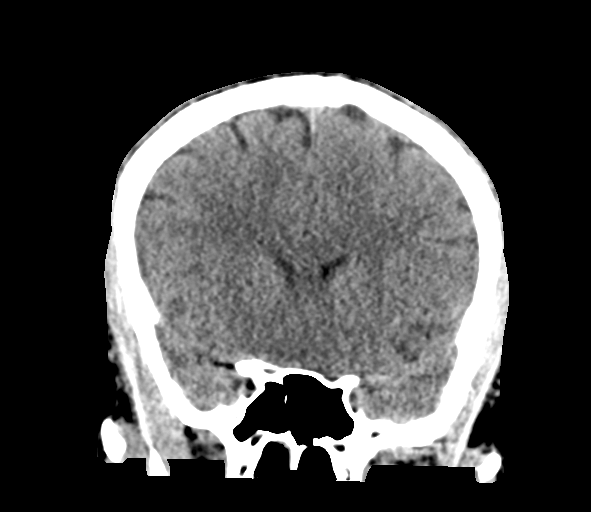
[im 31/69  brain]
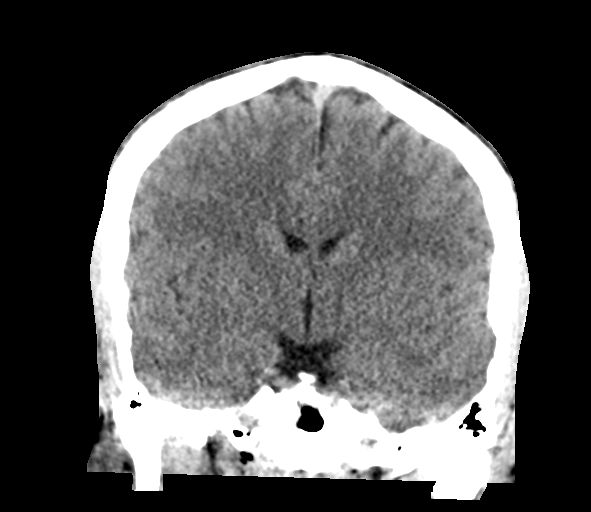
[im 38/69  brain]
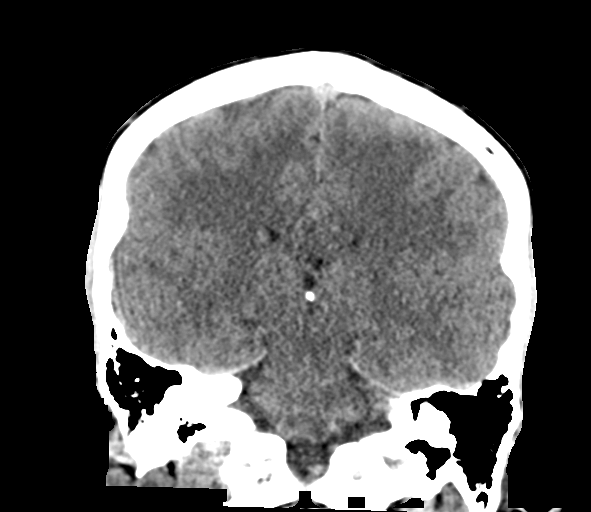

[Series 6: head without sag · sagittal · non-contrast · 0.36mm/px · 3 of 53 slices shown]
[im 18/53  brain]
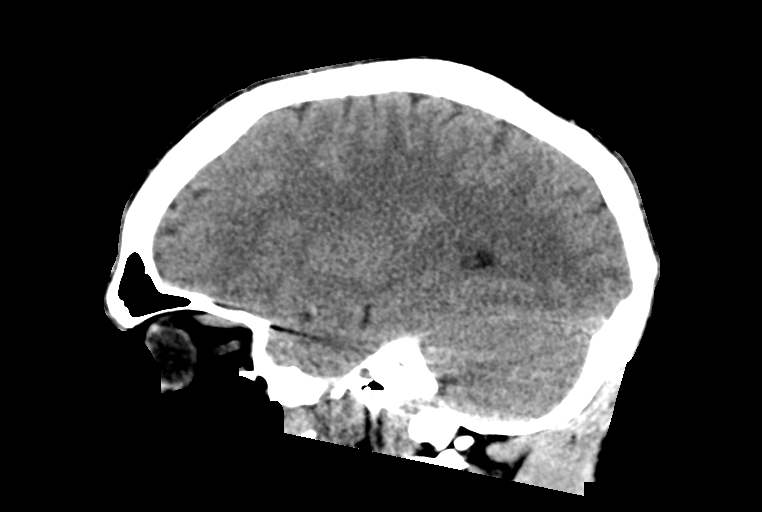
[im 27/53  brain]
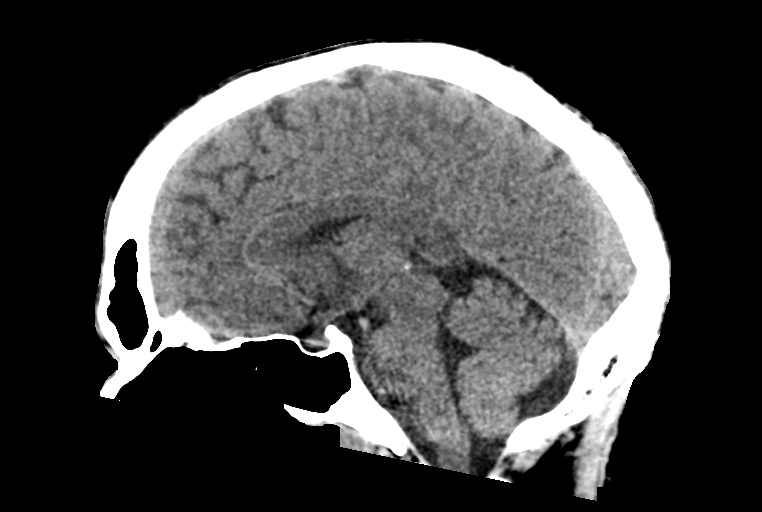
[im 35/53  brain]
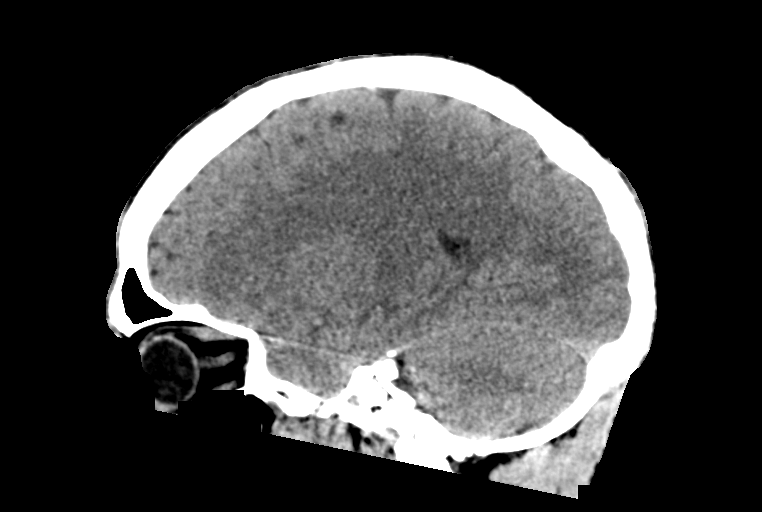

[15 of 47 positions shown; findings below may reference images not displayed]

FINDINGS: Brain: No evidence of acute infarction, hemorrhage, hydrocephalus,
extra-axial collection or mass lesion/mass effect.

The posterior fossa, including the cerebellum, brainstem and fourth
ventricle, is within normal limits. The third and lateral
ventricles, and basal ganglia are unremarkable in appearance. The
cerebral hemispheres are symmetric in appearance, with normal
gray-white differentiation. No mass effect or midline shift is seen.

Vascular: No hyperdense vessel or unexpected calcification.

Skull: There is no evidence of fracture; visualized osseous
structures are unremarkable in appearance.

Sinuses/Orbits: The visualized portions of the orbits are within
normal limits. The paranasal sinuses and mastoid air cells are
well-aerated.

Other: No significant soft tissue abnormalities are seen.
IMPRESSION: No evidence of traumatic intracranial injury or fracture.
